# Patient Record
Sex: Female | Born: 1960 | Race: Black or African American | Hispanic: No | Smoking: Never smoker
Health system: Southern US, Community
[De-identification: ages and names within clinical notes are randomized; demographics above are authoritative.]

## PROBLEM LIST (undated history)

## (undated) DIAGNOSIS — I10 Essential (primary) hypertension: Secondary | ICD-10-CM

## (undated) DIAGNOSIS — M7138 Other bursal cyst, other site: Secondary | ICD-10-CM

## (undated) HISTORY — DX: Essential (primary) hypertension: I10

## (undated) HISTORY — DX: Other bursal cyst, other site: M71.38

---

## 2012-05-20 DIAGNOSIS — M1712 Unilateral primary osteoarthritis, left knee: Secondary | ICD-10-CM | POA: Insufficient documentation

## 2021-04-04 ENCOUNTER — Telehealth: Payer: BC Managed Care – PPO | Admitting: Nurse Practitioner

## 2021-04-04 DIAGNOSIS — L309 Dermatitis, unspecified: Secondary | ICD-10-CM | POA: Diagnosis not present

## 2021-04-04 DIAGNOSIS — L089 Local infection of the skin and subcutaneous tissue, unspecified: Secondary | ICD-10-CM

## 2021-04-04 MED ORDER — CEPHALEXIN 500 MG PO CAPS: 500.0000 mg | ORAL_CAPSULE | Freq: Two times a day (BID) | ORAL | 0 refills | Status: AC

## 2021-04-04 MED ORDER — TRIAMCINOLONE ACETONIDE 0.1 % EX CREA: 1.0000 "application " | TOPICAL_CREAM | Freq: Two times a day (BID) | CUTANEOUS | 0 refills | Status: AC

## 2021-04-04 NOTE — Progress Notes (Signed)
Virtual Visit Consent   [facility], you are scheduled for a virtual visit with a [facility] provider today.     Just as with appointments in the office, your consent must be obtained to participate.  Your consent will be active for this visit and any virtual visit you may have with one of our providers in the next 365 days.     If you have a MyChart account, a copy of this consent can be sent to you electronically.  All virtual visits are billed to your insurance company just like a traditional visit in the office.    As this is a virtual visit, video technology does not allow for your provider to perform a traditional examination.  This may limit your provider's ability to fully assess your condition.  If your provider identifies any concerns that need to be evaluated in person or the need to arrange testing (such as labs, EKG, etc.), we will make arrangements to do so.     Although advances in technology are sophisticated, we cannot ensure that it will always work on either your end or our end.  If the connection with a video visit is poor, the visit may have to be switched to a telephone visit.  With either a video or telephone visit, we are not always able to ensure that we have a secure connection.     I need to obtain your verbal consent now.   Are you willing to proceed with your visit today?    Whitney Roman has provided verbal consent on 04/04/2021 for a virtual visit (video or telephone).   Whitney Schneiders, FNP   Date: 04/04/2021 8:15 AM   Virtual Visit via Video Note   I, Whitney Roman, connected with  Whitney Roman  ([id number], 09-13-1960) on 04/04/21 at  8:15 AM EST by a video-enabled telemedicine application and verified that I am speaking with the correct person using two identifiers.  Location: Patient: Virtual Visit Location Patient: Home Provider: Virtual Visit Location Provider: Home Office   I discussed the limitations of evaluation and management by telemedicine and the  availability of in person appointments. The patient expressed understanding and agreed to proceed.    History of Present Illness: Whitney Roman is a 61 y.o. who identifies as a female who was assigned female at birth, and is being seen today for a recurrent itchy ear on the right. Three weeks ago this happened and she noted small bumps that appeared like a bite and she had puss draining from it at that time.  That resolved by placing neosporin to the area.   The ear does not hurt but it is warm to touch. Denies any drainage right now.   She denies any new soaps or lotions. Notes that she has sensitive skin.  Denies any new medications or supplements.    Problems: There are no problems to display for this patient.   No Known Allergies   Current Outpatient Medications  Medication Instructions   LISINOPRIL PO No dose, route, or frequency recorded.   Multiple Vitamin (MULTIVITAMIN ADULT PO) 1 tablet, Oral, Daily     Observations/Objective: Patient is well-developed, well-nourished in no acute distress.  Resting comfortably at home.  Head is normocephalic, atraumatic.  No labored breathing.  Speech is clear and coherent with logical content.  Patient is alert and oriented at baseline.    Assessment and Plan: 1. Dermatitis  - triamcinolone cream (KENALOG) 0.1 %; Apply 1 application topically 2 (two) times  daily for 10 days.  Dispense: 30 g; Refill: 0 Do not use for longer that 2 weeks as discussed  Apply to ear only   2. Skin infection  - cephALEXin (KEFLEX) 500 MG capsule; Take 1 capsule (500 mg total) by mouth 2 (two) times daily for 10 days.  Dispense: 20 capsule; Refill: 0    Wash with warm water and mild soap as discussed   Follow Up Instructions: I discussed the assessment and treatment plan with the patient. The patient was provided an opportunity to ask questions and all were answered. The patient agreed with the plan and demonstrated an understanding of the instructions.   A copy of instructions were sent to the patient via MyChart unless otherwise noted below.    The patient was advised to call back or seek an in-person evaluation if the symptoms worsen or if the condition fails to improve as anticipated.  Time:  I spent 10 minutes with the patient via telehealth technology discussing the above problems/concerns.    Whitney Schneiders, FNP

## 2021-05-27 ENCOUNTER — Telehealth: Payer: BC Managed Care – PPO | Admitting: Family Medicine

## 2021-05-27 DIAGNOSIS — L309 Dermatitis, unspecified: Secondary | ICD-10-CM | POA: Diagnosis not present

## 2021-05-27 DIAGNOSIS — H6011 Cellulitis of right external ear: Secondary | ICD-10-CM

## 2021-05-27 MED ORDER — CEPHALEXIN 500 MG PO CAPS: 500.0000 mg | ORAL_CAPSULE | Freq: Three times a day (TID) | ORAL | 0 refills | Status: AC

## 2021-05-27 NOTE — Progress Notes (Signed)
?Virtual Visit Consent  ? ?[facility], you are scheduled for a virtual visit with a [facility] provider today.   ?  ?Just as with appointments in the office, your consent must be obtained to participate.  Your consent will be active for this visit and any virtual visit you may have with one of our providers in the next 365 days.   ?  ?If you have a MyChart account, a copy of this consent can be sent to you electronically.  All virtual visits are billed to your insurance company just like a traditional visit in the office.   ? ?As this is a virtual visit, video technology does not allow for your provider to perform a traditional examination.  This may limit your provider's ability to fully assess your condition.  If your provider identifies any concerns that need to be evaluated in person or the need to arrange testing (such as labs, EKG, etc.), we will make arrangements to do so.   ?  ?Although advances in technology are sophisticated, we cannot ensure that it will always work on either your end or our end.  If the connection with a video visit is poor, the visit may have to be switched to a telephone visit.  With either a video or telephone visit, we are not always able to ensure that we have a secure connection.    ? ?I need to obtain your verbal consent now.   Are you willing to proceed with your visit today?  ?  ?Whitney Roman has provided verbal consent on 05/27/2021 for a virtual visit (video or telephone). ?  ?Whitney Curio, FNP  ? ?Date: 05/27/2021 11:38 AM ? ? ?Virtual Visit via Video Note  ? ?[name], connected with  Whitney Roman  ([id number], 04-Dec-1960) on 05/27/21 at 11:15 AM EDT by a video-enabled telemedicine application and verified that I am speaking with the correct person using two identifiers. ? ?Location: ?Patient: Virtual Visit Location Patient: Mobile ?Provider: Virtual Visit Location Provider: Home Office ?  ?I discussed the limitations of evaluation and management by telemedicine and the  availability of in person appointments. The patient expressed understanding and agreed to proceed.   ? ?History of Present Illness: ?Whitney Roman is a 61 y.o. who identifies as a female who was assigned female at birth, and is being seen today for ear pain. ? ?HPI: Otalgia  ?There is pain in the right ear. This is a recurrent problem. The current episode started yesterday. The problem occurs every few minutes. The problem has been gradually worsening. There has been no fever. Pertinent negatives include no rhinorrhea.   ?Problems: There are no problems to display for this patient. ?  ?Allergies: No Known Allergies ?Medications:  ?Current Outpatient Medications:  ?  cephALEXin (KEFLEX) 500 MG capsule, Take 1 capsule (500 mg total) by mouth 3 (three) times daily for 7 days., Disp: 21 capsule, Rfl: 0 ?  LISINOPRIL PO, , Disp: , Rfl:  ?  Multiple Vitamin (MULTIVITAMIN ADULT PO), Take 1 tablet by mouth daily., Disp: , Rfl:  ? ?Observations/Objective: ?Patient is well-developed, well-nourished in no acute distress.  ?Resting comfortably.  ?Head is normocephalic, atraumatic.  ?No labored breathing.  ?Speech is clear and coherent with logical content.  ?Patient is alert and oriented at baseline.  ? ? ?Assessment and Plan: ?1. Dermatitis of external ear ?- cephALEXin (KEFLEX) 500 MG capsule; Take 1 capsule (500 mg total) by mouth 3 (three) times daily for 7 days.  Dispense:  21 capsule; Refill: 0 ? ?2. Cellulitis of right external ear ?- cephALEXin (KEFLEX) 500 MG capsule; Take 1 capsule (500 mg total) by mouth 3 (three) times daily for 7 days.  Dispense: 21 capsule; Refill: 0 ? ?Dermatitis with secondary cellulitis rt external ear. Keflex prescribed. Continue triamcinolone cream. Warm compresses. Keep external ear clean and dry. Follow up at urgent care if symptoms worsen.  ? ?Follow Up Instructions: ?I discussed the assessment and treatment plan with the patient. The patient was provided an opportunity to ask questions and  all were answered. The patient agreed with the plan and demonstrated an understanding of the instructions.  A copy of instructions were sent to the patient via MyChart unless otherwise noted below.  ? ? ? ?The patient was advised to call back or seek an in-person evaluation if the symptoms worsen or if the condition fails to improve as anticipated. ? ?Time:  ?I spent 10 minutes with the patient via telehealth technology discussing the above problems/concerns.   ? ?Whitney Curio, FNP ? ?

## 2021-05-27 NOTE — Patient Instructions (Signed)
?Whitney Roman, thank you for joining Georgana Curio, FNP for today's virtual visit.  While this provider is not your primary care provider (PCP), if your PCP is located in our provider database this encounter information will be shared with them immediately following your visit. ? ?Consent: ?(Patient) Bryttani Blew provided verbal consent for this virtual visit at the beginning of the encounter. ? ?Current Medications: ? ?Current Outpatient Medications:  ?  cephALEXin (KEFLEX) 500 MG capsule, Take 1 capsule (500 mg total) by mouth 3 (three) times daily for 7 days., Disp: 21 capsule, Rfl: 0 ?  LISINOPRIL PO, , Disp: , Rfl:  ?  Multiple Vitamin (MULTIVITAMIN ADULT PO), Take 1 tablet by mouth daily., Disp: , Rfl:   ? ?Medications ordered in this encounter:  ?Meds ordered this encounter  ?Medications  ? cephALEXin (KEFLEX) 500 MG capsule  ?  Sig: Take 1 capsule (500 mg total) by mouth 3 (three) times daily for 7 days.  ?  Dispense:  21 capsule  ?  Refill:  0  ?  Order Specific Question:   Supervising Provider  ?  Answer:   Eber Hong [[id number]]  ?  ? ?*If you need refills on other medications prior to your next appointment, please contact your pharmacy* ? ?Follow-Up: ?Call back or seek an in-person evaluation if the symptoms worsen or if the condition fails to improve as anticipated. ? ?Other Instructions ?Atopic Dermatitis ?Atopic dermatitis is a skin disorder that causes inflammation of the skin. It is marked by a red rash and itchy, dry, scaly skin. It is the most common type of eczema. Eczema is a group of skin conditions that cause the skin to become rough and swollen. This condition is generally worse during the cooler winter months and often improves during the warm summer months. ?Atopic dermatitis usually starts showing signs in infancy and can last through adulthood. This condition cannot be passed from one person to another (is not contagious). Atopic dermatitis may not always be present, but when it is, it is  called a flare-up. ?What are the causes? ?The exact cause of this condition is not known. Flare-ups may be triggered by: ?Coming in contact with something that you are sensitive or allergic to (allergen). ?Stress. ?Certain foods. ?Extremely hot or cold weather. ?Harsh chemicals and soaps. ?Dry air. ?Chlorine. ?What increases the risk? ?This condition is more likely to develop in people who have a personal or family history of: ?Eczema. ?Allergies. ?Asthma. ?Hay fever. ?What are the signs or symptoms? ?Symptoms of this condition include: ?Dry, scaly skin. ?Red, itchy rash. ?Itchiness, which can be severe. This may occur before the skin rash. This can make sleeping difficult. ?Skin thickening and cracking that can occur over time. ?How is this diagnosed? ?This condition is diagnosed based on: ?Your symptoms. ?Your medical history. ?A physical exam. ?How is this treated? ?There is no cure for this condition, but symptoms can usually be controlled. Treatment focuses on: ?Controlling the itchiness and scratching. You may be given medicines, such as antihistamines or steroid creams. ?Limiting exposure to allergens. ?Recognizing situations that cause stress and developing a plan to manage stress. ?If your atopic dermatitis does not get better with medicines, or if it is all over your body (widespread), a treatment using a specific type of light (phototherapy) may be used. ?Follow these instructions at home: ?Skin care ? ?Keep your skin well moisturized. Doing this seals in moisture and helps to prevent dryness. ?Use unscented lotions that have petroleum in them. ?Avoid  lotions that contain alcohol or water. They can dry the skin. ?Keep baths or showers short (less than 5 minutes) in warm water. Do not use hot water. ?Use mild, unscented cleansers for bathing. Avoid soap and bubble bath. ?Apply a moisturizer to your skin right after a bath or shower. ?Do not apply anything to your skin without checking with your health care  provider. ?General instructions ?Take or apply over-the-counter and prescription medicines only as told by your health care provider. ?Dress in clothes made of cotton or cotton blends. Dress lightly because heat increases itchiness. ?When washing your clothes, rinse your clothes twice so all of the soap is removed. ?Avoid any triggers that can cause a flare-up. ?Keep your fingernails cut short. ?Avoid scratching. Scratching makes the rash and itchiness worse. A break in the skin from scratching could result in a skin infection (impetigo). ?Do not be around people who have cold sores or fever blisters. If you get the infection, it may cause your atopic dermatitis to worsen. ?Keep all follow-up visits. This is important. ?Contact a health care provider if: ?Your itchiness interferes with sleep. ?Your rash gets worse or is not better within one week of starting treatment. ?You have a fever. ?You have a rash flare-up after having contact with someone who has cold sores or fever blisters. ?Get help right away if: ?You develop pus or soft yellow scabs in the rash area. ?Summary ?Atopic dermatitis causes a red rash and itchy, dry, scaly skin. ?Treatment focuses on controlling the itchiness and scratching, limiting exposure to things that you are sensitive or allergic to (allergens), recognizing situations that cause stress, and developing a plan to manage stress. ?Keep your skin well moisturized. ?Keep baths or showers shorter than 5 minutes and use warm water. Do not use hot water. ?This information is not intended to replace advice given to you by your health care provider. Make sure you discuss any questions you have with your health care provider. ?Document Revised: 12/08/2019 Document Reviewed: 12/08/2019 ?Elsevier Patient Education ? 2022 Elsevier [employer] ? ? ? ?If you have been instructed to have an in-person evaluation today at a local Urgent Care facility, please use the link below. It will take you to a list of all  of our available [facility], including address, phone number and hours of operation. Please do not delay care.  ?[facility] ? ?If you or a family member do not have a primary care provider, use the link below to schedule a visit and establish care. When you choose a [facility] primary care physician or advanced practice provider, you gain a long-term partner in health. ?Find a Primary Care Provider ? ?Learn more about [facility]'s in-office and virtual care options: ? - Get Care Now ? ?

## 2021-06-03 ENCOUNTER — Ambulatory Visit (INDEPENDENT_AMBULATORY_CARE_PROVIDER_SITE_OTHER): Payer: BC Managed Care – PPO | Admitting: Nurse Practitioner

## 2021-06-03 ENCOUNTER — Other Ambulatory Visit: Payer: Self-pay

## 2021-06-03 ENCOUNTER — Encounter: Payer: Self-pay | Admitting: Nurse Practitioner

## 2021-06-03 VITALS — BP 136/75 | HR 87 | Temp 98.1°F | Ht 62.0 in | Wt 195.4 lb

## 2021-06-03 DIAGNOSIS — Z7689 Persons encountering health services in other specified circumstances: Secondary | ICD-10-CM

## 2021-06-03 DIAGNOSIS — I10 Essential (primary) hypertension: Secondary | ICD-10-CM

## 2021-06-03 DIAGNOSIS — R635 Abnormal weight gain: Secondary | ICD-10-CM

## 2021-06-03 DIAGNOSIS — R5383 Other fatigue: Secondary | ICD-10-CM | POA: Diagnosis not present

## 2021-06-03 DIAGNOSIS — Z6835 Body mass index (BMI) 35.0-35.9, adult: Secondary | ICD-10-CM | POA: Diagnosis not present

## 2021-06-03 DIAGNOSIS — Z1231 Encounter for screening mammogram for malignant neoplasm of breast: Secondary | ICD-10-CM

## 2021-06-03 NOTE — Progress Notes (Signed)
? ?New Patient Office Visit ? ?Subjective:  ?Patient ID: Whitney Roman, female    DOB: [birth year]  Age: 61 y.o. MRN: [id number] ? ?CC:  ?Chief Complaint  ?Patient presents with  ? New Patient (Initial Visit)  ? ? ?HPI ?Whitney Roman presents to establish new primary care care provider. She moved to the area from [place], [place].  ?-history of hypertension.  ?-concern about weight. States that she has always been concerned about weight, but now, wants to get a healthier version of herself.  ?-currently o antibiotics due to infection of right ear . ?-due to have routine, fasting labs and well woman exam.  ?-due to have screening mammogram  ? ?History reviewed. No pertinent past medical history. ? ?History reviewed. No pertinent surgical history. ? ?Family History  ?Problem Relation Age of Onset  ? High blood pressure Father   ? ? ?Social History  ? ?Socioeconomic History  ? Marital status: Unknown  ?  Spouse name: Not on file  ? Number of children: Not on file  ? Years of education: Not on file  ? Highest education level: Not on file  ?Occupational History  ? Not on file  ?Tobacco Use  ? Smoking status: Never  ? Smokeless tobacco: Never  ?Substance and Sexual Activity  ? Alcohol use: Yes  ?  Comment: 1  ? Drug use: Never  ? Sexual activity: Not Currently  ?Other Topics Concern  ? Not on file  ?Social History Narrative  ? Not on file  ? ?Social Determinants of Health  ? ?Financial Resource Strain: Not on file  ?Food Insecurity: Not on file  ?Transportation Needs: Not on file  ?Physical Activity: Not on file  ?Stress: Not on file  ?Social Connections: Not on file  ?Intimate Partner Violence: Not on file  ? ? ?ROS ?Review of Systems  ?Constitutional:  Positive for fatigue. Negative for activity change, appetite change, chills and fever.  ?HENT:  Positive for congestion and ear pain. Negative for postnasal drip, rhinorrhea, sinus pressure, sinus pain, sneezing and sore throat.   ?Eyes: Negative.   ?Respiratory:  Negative for  cough, chest tightness, shortness of breath and wheezing.   ?Cardiovascular:  Negative for chest pain and palpitations.  ?Gastrointestinal:  Negative for abdominal pain, constipation, diarrhea, nausea and vomiting.  ?Endocrine: Negative for cold intolerance, heat intolerance, polydipsia and polyuria.  ?Genitourinary:  Negative for dyspareunia, dysuria, flank pain, frequency and urgency.  ?Musculoskeletal:  Positive for arthralgias, gait problem and myalgias. Negative for back pain.  ?     Joint pain in multiple areas.   ?Skin:  Negative for rash.  ?Allergic/Immunologic: Negative for environmental allergies.  ?Neurological:  Negative for dizziness, weakness and headaches.  ?Hematological:  Negative for adenopathy.  ?Psychiatric/Behavioral:  The patient is not nervous/anxious.   ? ?Objective:  ? ?Today's Vitals  ? 06/03/21 1046  ?BP: 136/75  ?Pulse: 87  ?Temp: 98.1 ?F (36.7 ?C)  ?SpO2: 97%  ?Weight: 195 lb 6.4 oz (88.6 kg)  ?Height: 5\' 2"  (1.575 m)  ? ?Body mass index is 35.74 kg/m?.  ? ?Physical Exam ?Vitals and nursing note reviewed.  ?Constitutional:   ?   Appearance: Normal appearance. She is well-developed.  ?HENT:  ?   Head: Normocephalic and atraumatic.  ?   Nose: Nose normal.  ?   Mouth/Throat:  ?   Mouth: Mucous membranes are moist.  ?   Pharynx: Oropharynx is clear.  ?Eyes:  ?   Extraocular Movements: Extraocular movements intact.  ?  Conjunctiva/sclera: Conjunctivae normal.  ?   Pupils: Pupils are equal, round, and reactive to light.  ?Cardiovascular:  ?   Rate and Rhythm: Normal rate and regular rhythm.  ?   Pulses: Normal pulses.  ?   Heart sounds: Normal heart sounds.  ?Pulmonary:  ?   Effort: Pulmonary effort is normal.  ?   Breath sounds: Normal breath sounds.  ?Abdominal:  ?   Palpations: Abdomen is soft.  ?Musculoskeletal:     ?   General: Normal range of motion.  ?   Cervical back: Normal range of motion and neck supple.  ?Lymphadenopathy:  ?   Cervical: No cervical adenopathy.  ?Skin: ?   General:  Skin is warm and dry.  ?   Capillary Refill: Capillary refill takes less than 2 seconds.  ?Neurological:  ?   General: No focal deficit present.  ?   Mental Status: She is alert and oriented to person, place, and time.  ?Psychiatric:     ?   Mood and Affect: Mood normal.     ?   Behavior: Behavior normal.     ?   Thought Content: Thought content normal.     ?   Judgment: Judgment normal.  ? ? ?Assessment & Plan:  ?1. Essential hypertension ?Blood pressure currently well managed.  Continue lisinopril as prescribed. ? ?2. Other fatigue ?We will check routine, fasting labs, including full thyroid panel and anemia panel at next visit. ? ?3. Abnormal weight gain ?Check thyroid panel at next visit. ? ?4. Body mass index (BMI) of 35.0-35.9 in adult ?Discussed lowering calorie intake to 1500 calories per day and incorporating exercise into daily routine to help lose weight.  ? ?5. Encounter for screening mammogram for malignant neoplasm of breast ?Screening mammogram ordered today. ?- MM DIGITAL SCREENING BILATERAL; Future ? ?6. Encounter to establish care ?Appointment today to establish new primary care provider   ? ?  ?Problem List Items Addressed This Visit   ? ?  ? Cardiovascular and Mediastinum  ? Essential hypertension - Primary  ?  ? Other  ? Other fatigue  ? Abnormal weight gain  ? Body mass index (BMI) of 35.0-35.9 in adult  ? ?Other Visit Diagnoses   ? ? Encounter for screening mammogram for malignant neoplasm of breast      ? Relevant Orders  ? MM DIGITAL SCREENING BILATERAL  ? Encounter to establish care      ? ?  ? ? ?Outpatient Encounter Medications as of 06/03/2021  ?Medication Sig  ? [EXPIRED] cephALEXin (KEFLEX) 500 MG capsule Take 1 capsule (500 mg total) by mouth 3 (three) times daily for 7 days.  ? LISINOPRIL PO   ? Multiple Vitamin (MULTIVITAMIN ADULT PO) Take 1 tablet by mouth daily.  ? ?No facility-administered encounter medications on file as of 06/03/2021.  ? ? ?Follow-up: Return in about 3 weeks  (around 06/24/2021) for health maintenance exam, with pap, FBW at time of visit - AM appointment please - .  ? ?Carlean Jews, NP ? ?

## 2021-06-12 DIAGNOSIS — Z6835 Body mass index (BMI) 35.0-35.9, adult: Secondary | ICD-10-CM

## 2021-06-12 DIAGNOSIS — R635 Abnormal weight gain: Secondary | ICD-10-CM | POA: Insufficient documentation

## 2021-06-12 DIAGNOSIS — R5383 Other fatigue: Secondary | ICD-10-CM | POA: Insufficient documentation

## 2021-06-12 DIAGNOSIS — I10 Essential (primary) hypertension: Secondary | ICD-10-CM | POA: Insufficient documentation

## 2021-06-12 HISTORY — DX: Body mass index (BMI) 35.0-35.9, adult: Z68.35

## 2021-06-12 HISTORY — DX: Abnormal weight gain: R63.5

## 2021-06-12 HISTORY — DX: Other fatigue: R53.83

## 2021-06-24 ENCOUNTER — Encounter: Payer: BC Managed Care – PPO | Admitting: Nurse Practitioner

## 2021-06-30 ENCOUNTER — Ambulatory Visit: Payer: BC Managed Care – PPO | Admitting: Nurse Practitioner

## 2021-07-11 ENCOUNTER — Ambulatory Visit: Payer: BC Managed Care – PPO | Admitting: Nurse Practitioner

## 2021-07-11 ENCOUNTER — Ambulatory Visit: Payer: BC Managed Care – PPO

## 2021-07-11 ENCOUNTER — Encounter: Payer: Self-pay | Admitting: Nurse Practitioner

## 2021-07-11 VITALS — BP 135/76 | HR 82 | Temp 98.2°F | Ht 61.81 in | Wt 194.7 lb

## 2021-07-11 DIAGNOSIS — I10 Essential (primary) hypertension: Secondary | ICD-10-CM

## 2021-07-11 DIAGNOSIS — M170 Bilateral primary osteoarthritis of knee: Secondary | ICD-10-CM

## 2021-07-11 DIAGNOSIS — Z13228 Encounter for screening for other metabolic disorders: Secondary | ICD-10-CM

## 2021-07-11 DIAGNOSIS — Z1329 Encounter for screening for other suspected endocrine disorder: Secondary | ICD-10-CM

## 2021-07-11 DIAGNOSIS — Z130 Encounter for screening for diseases of the blood and blood-forming organs and certain disorders involving the immune mechanism: Secondary | ICD-10-CM

## 2021-07-11 DIAGNOSIS — Z0001 Encounter for general adult medical examination with abnormal findings: Secondary | ICD-10-CM | POA: Diagnosis not present

## 2021-07-11 DIAGNOSIS — Z6835 Body mass index (BMI) 35.0-35.9, adult: Secondary | ICD-10-CM

## 2021-07-11 DIAGNOSIS — Z0000 Encounter for general adult medical examination without abnormal findings: Secondary | ICD-10-CM

## 2021-07-11 DIAGNOSIS — Z1321 Encounter for screening for nutritional disorder: Secondary | ICD-10-CM

## 2021-07-11 HISTORY — DX: Bilateral primary osteoarthritis of knee: M17.0

## 2021-07-11 MED ORDER — LISINOPRIL 5 MG PO TABS: 5.0000 mg | ORAL_TABLET | Freq: Every day | ORAL | 1 refills | Status: DC

## 2021-07-11 NOTE — Progress Notes (Signed)
Established patient visit ? ? ?Patient: Whitney Roman   DOB: Feb 08, 1961   61 y.o. Female  MRN: [id number] ?Visit Date: 07/11/2021 ? ? ?Chief Complaint  ?Patient presents with  ? Annual Exam  ? ?Subjective  ?  ?HPI  ?Annual physical she declines pap smear today.  ?-needs to have fasting blood work done. Will draw this while she is here today  ?-scheduled to have screening mammogram later today.  ?-blood pressure typically well controlled.  ?-bilateral knee pain, worse on the left than the right. While living in [place], she did have orthopedic provider who did give her injections into the knee which helped. She would like to be referred to Dr. Ennis Forts, in [place] for further evaluation and treatment . ?-she is concerned about weight. Would like to get it down. Feels like getting her weight down, will help improve her knee pain at least some.  ?-patient states that she would like to be referred to GI for screening colonoscopy. She will call or message the office with provider she would like to see. Will make referral.  ? ?Medications: ?Outpatient Medications Prior to Visit  ?Medication Sig  ? Multiple Vitamin (MULTIVITAMIN ADULT PO) Take 1 tablet by mouth daily.  ? [DISCONTINUED] LISINOPRIL PO   ? ?No facility-administered medications prior to visit.  ? ? ?Review of Systems  ?Constitutional:  Negative for activity change, appetite change, chills, fatigue and fever.  ?HENT:  Negative for congestion, postnasal drip, rhinorrhea, sinus pressure, sinus pain, sneezing and sore throat.   ?Eyes: Negative.   ?Respiratory:  Negative for cough, chest tightness, shortness of breath and wheezing.   ?Cardiovascular:  Negative for chest pain and palpitations.  ?Gastrointestinal:  Negative for abdominal pain, constipation, diarrhea, nausea and vomiting.  ?Endocrine: Negative for cold intolerance, heat intolerance, polydipsia and polyuria.  ?Genitourinary:  Negative for dyspareunia, dysuria, flank pain, frequency and urgency.   ?Musculoskeletal:  Positive for arthralgias and joint swelling. Negative for back pain and myalgias.  ?     Pain in both knees, but primarily the left side.   ?Skin:  Negative for rash.  ?Allergic/Immunologic: Negative for environmental allergies.  ?Neurological:  Negative for dizziness, weakness and headaches.  ?Hematological:  Negative for adenopathy.  ?Psychiatric/Behavioral:  The patient is not nervous/anxious.   ? ? ? ? Objective  ?  ? ?Today's Vitals  ? 07/11/21 0816  ?BP: 135/76  ?Pulse: 82  ?Temp: 98.2 ?F (36.8 ?C)  ?SpO2: 100%  ?Weight: 194 lb 11.2 oz (88.3 kg)  ?Height: 5' 1.81" (1.57 m)  ? ?Body mass index is 35.83 kg/m?.  ? ?BP Readings from Last 3 Encounters:  ?07/11/21 135/76  ?06/03/21 136/75  ?  ?Wt Readings from Last 3 Encounters:  ?07/11/21 194 lb 11.2 oz (88.3 kg)  ?06/03/21 195 lb 6.4 oz (88.6 kg)  ?  ?Physical Exam ?Vitals and nursing note reviewed.  ?Constitutional:   ?   Appearance: Normal appearance. She is well-developed.  ?HENT:  ?   Head: Normocephalic and atraumatic.  ?   Right Ear: Tympanic membrane, ear canal and external ear normal.  ?   Left Ear: Tympanic membrane, ear canal and external ear normal.  ?   Nose: Nose normal.  ?   Mouth/Throat:  ?   Mouth: Mucous membranes are moist.  ?   Pharynx: Oropharynx is clear.  ?Eyes:  ?   Conjunctiva/sclera: Conjunctivae normal.  ?   Pupils: Pupils are equal, round, and reactive to light.  ?Neck:  ?  Vascular: No carotid bruit.  ?Cardiovascular:  ?   Rate and Rhythm: Normal rate and regular rhythm.  ?   Pulses: Normal pulses.  ?   Heart sounds: Normal heart sounds.  ?Pulmonary:  ?   Effort: Pulmonary effort is normal.  ?   Breath sounds: Normal breath sounds.  ?Abdominal:  ?   General: Bowel sounds are normal. There is no distension.  ?   Palpations: Abdomen is soft. There is no mass.  ?   Tenderness: There is no abdominal tenderness. There is no right CVA tenderness, left CVA tenderness, guarding or rebound.  ?   Hernia: No hernia is present.   ?Musculoskeletal:  ?   Cervical back: Normal range of motion and neck supple.  ?   Left knee: Swelling, bony tenderness and crepitus present. Decreased range of motion. Tenderness present.  ?Lymphadenopathy:  ?   Cervical: No cervical adenopathy.  ?Skin: ?   General: Skin is warm and dry.  ?   Capillary Refill: Capillary refill takes less than 2 seconds.  ?Neurological:  ?   General: No focal deficit present.  ?   Mental Status: She is alert and oriented to person, place, and time.  ?Psychiatric:     ?   Mood and Affect: Mood normal.     ?   Behavior: Behavior normal.     ?   Thought Content: Thought content normal.     ?   Judgment: Judgment normal.  ?  ? ? Assessment & Plan  ?  ?1. Encounter for general adult medical examination with abnormal findings ?Annual physical today. Routine, fasting labs drawn during today's visit.  ? ?2. Essential hypertension ?Blood pressure stable. Continue lisinopril 5mg  daily. Refills provided today  ?- lisinopril (ZESTRIL) 5 MG tablet; Take 1 tablet (5 mg total) by mouth daily.  Dispense: 90 tablet; Refill: 1 ? ?3. Primary osteoarthritis of knees, bilateral ?Referral placed to Dr. Tacey Ruiz in [place] per patient request.  ?- Ambulatory referral to Orthopedic Surgery ? ?4. Body mass index (BMI) of 35.0-35.9 in adult ?Discussed lowering calorie intake to 1500 calories per day and incorporating exercise into daily routine to help lose weight. Will discuss medically managed weight loss options after lab results are back.  ? ?5. Screening for endocrine, nutritional, metabolic and immunity disorder ?Routine, fasting labs drawn during today's visit.  ?- Comp Met (CMET); Future ?- TSH; Future ?- CBC with Differential/Platelet; Future ?- Hemoglobin A1c; Future ?- Lipid panel; Future ?- Lipid panel ?- Hemoglobin A1c ?- CBC with Differential/Platelet ?- TSH ?- Comp Met (CMET) ? ?6. Healthcare maintenance ?Routine, fasting labs drawn during today's visit.  ?- Comp Met (CMET); Future ?-  TSH; Future ?- CBC with Differential/Platelet; Future ?- Hemoglobin A1c; Future ?- Lipid panel; Future ?- Lipid panel ?- Hemoglobin A1c ?- CBC with Differential/Platelet ?- TSH ?- Comp Met (CMET)  ? ? ?Problem List Items Addressed This Visit   ? ?  ? Cardiovascular and Mediastinum  ? Essential hypertension  ? Relevant Medications  ? lisinopril (ZESTRIL) 5 MG tablet  ?  ? Musculoskeletal and Integument  ? Primary osteoarthritis of knees, bilateral  ? Relevant Orders  ? Ambulatory referral to Orthopedic Surgery  ?  ? Other  ? Body mass index (BMI) of 35.0-35.9 in adult  ? ?Other Visit Diagnoses   ? ? Encounter for general adult medical examination with abnormal findings    -  Primary  ? Screening for endocrine, nutritional, metabolic and immunity disorder      ?  Relevant Orders  ? Comp Met (CMET)  ? TSH  ? CBC with Differential/Platelet  ? Hemoglobin A1c  ? Lipid panel  ? Healthcare maintenance      ? Relevant Orders  ? Comp Met (CMET)  ? TSH  ? CBC with Differential/Platelet  ? Hemoglobin A1c  ? Lipid panel  ? ?  ?  ? ?Return in about 6 months (around 01/11/2022) for blood pressure.  ?   ? ? ? ? ?Ronnell Freshwater, NP  ? [facility] ?[phone] (phone) ?[phone] (fax) ? ? [facility]  ?

## 2021-07-12 LAB — LIPID PANEL
Chol/HDL Ratio: 3 ratio (ref 0.0–4.4)
Cholesterol, Total: 220 mg/dL — ABNORMAL HIGH (ref 100–199)
HDL: 74 mg/dL (ref >39)
LDL Chol Calc (NIH): 136 mg/dL — ABNORMAL HIGH (ref 0–99)
Triglycerides: 58 mg/dL (ref 0–149)
VLDL Cholesterol Cal: 10 mg/dL (ref 5–40)

## 2021-07-12 LAB — COMPREHENSIVE METABOLIC PANEL
ALT: 22 IU/L (ref 0–32)
AST: 18 IU/L (ref 0–40)
Albumin/Globulin Ratio: 1.7 (ref 1.2–2.2)
Albumin: 4.3 g/dL (ref 3.8–4.9)
Alkaline Phosphatase: 78 IU/L (ref 44–121)
BUN/Creatinine Ratio: 15 (ref 9–23)
BUN: 13 mg/dL (ref 6–24)
Bilirubin Total: 0.5 mg/dL (ref 0.0–1.2)
CO2: 24 mmol/L (ref 20–29)
Calcium: 9.7 mg/dL (ref 8.7–10.2)
Chloride: 103 mmol/L (ref 96–106)
Creatinine, Ser: 0.88 mg/dL (ref 0.57–1.00)
Globulin, Total: 2.5 g/dL (ref 1.5–4.5)
Glucose: 93 mg/dL (ref 70–99)
Potassium: 4.2 mmol/L (ref 3.5–5.2)
Sodium: 142 mmol/L (ref 134–144)
Total Protein: 6.8 g/dL (ref 6.0–8.5)
eGFR: 76 mL/min/{1.73_m2} (ref >59)

## 2021-07-12 LAB — CBC WITH DIFFERENTIAL/PLATELET
Basophils Absolute: 0.1 x10E3/uL (ref 0.0–0.2)
Basos: 1 %
EOS (ABSOLUTE): 0.2 x10E3/uL (ref 0.0–0.4)
Eos: 3 %
Hematocrit: 40.1 % (ref 34.0–46.6)
Hemoglobin: 12.9 g/dL (ref 11.1–15.9)
Immature Grans (Abs): 0 x10E3/uL (ref 0.0–0.1)
Immature Granulocytes: 0 %
Lymphocytes Absolute: 1.7 x10E3/uL (ref 0.7–3.1)
Lymphs: 25 %
MCH: 26.8 pg (ref 26.6–33.0)
MCHC: 32.2 g/dL (ref 31.5–35.7)
MCV: 83 fL (ref 79–97)
Monocytes Absolute: 0.6 x10E3/uL (ref 0.1–0.9)
Monocytes: 8 %
Neutrophils Absolute: 4.4 x10E3/uL (ref 1.4–7.0)
Neutrophils: 63 %
Platelets: 242 x10E3/uL (ref 150–450)
RBC: 4.82 x10E6/uL (ref 3.77–5.28)
RDW: 12.6 % (ref 11.7–15.4)
WBC: 6.9 x10E3/uL (ref 3.4–10.8)

## 2021-07-12 LAB — HEMOGLOBIN A1C
Est. average glucose Bld gHb Est-mCnc: 111 mg/dL
Hgb A1c MFr Bld: 5.5 % (ref 4.8–5.6)

## 2021-07-12 LAB — TSH: TSH: 0.583 u[IU]/mL (ref 0.450–4.500)

## 2021-07-14 ENCOUNTER — Encounter: Payer: Self-pay | Admitting: Nurse Practitioner

## 2021-07-22 ENCOUNTER — Ambulatory Visit
Admission: RE | Admit: 2021-07-22 | Discharge: 2021-07-22 | Disposition: A | Discharge status: Home / Self Care | Payer: BC Managed Care – PPO | Source: Ambulatory Visit | Attending: Nurse Practitioner | Admitting: Nurse Practitioner

## 2021-07-22 DIAGNOSIS — Z1231 Encounter for screening mammogram for malignant neoplasm of breast: Secondary | ICD-10-CM

## 2021-07-27 NOTE — Progress Notes (Signed)
Negative mammogram

## 2021-08-11 HISTORY — PX: TOTAL KNEE ARTHROPLASTY: SHX125

## 2021-10-18 ENCOUNTER — Telehealth: Payer: BC Managed Care – PPO | Admitting: Physician Assistant

## 2021-10-18 ENCOUNTER — Telehealth: Payer: BC Managed Care – PPO

## 2021-10-18 ENCOUNTER — Other Ambulatory Visit: Payer: Self-pay | Admitting: Physician Assistant

## 2021-10-18 DIAGNOSIS — L729 Follicular cyst of the skin and subcutaneous tissue, unspecified: Secondary | ICD-10-CM

## 2021-10-18 DIAGNOSIS — L089 Local infection of the skin and subcutaneous tissue, unspecified: Secondary | ICD-10-CM | POA: Diagnosis not present

## 2021-10-18 MED ORDER — CEPHALEXIN 500 MG PO CAPS: 500.0000 mg | ORAL_CAPSULE | Freq: Two times a day (BID) | ORAL | 0 refills | Status: AC

## 2022-01-26 DIAGNOSIS — M7138 Other bursal cyst, other site: Secondary | ICD-10-CM | POA: Insufficient documentation

## 2022-02-07 HISTORY — PX: HEMILAMINOTOMY LUMBAR SPINE: SUR654

## 2022-03-28 ENCOUNTER — Encounter: Payer: Self-pay | Admitting: Family Medicine

## 2022-03-28 ENCOUNTER — Ambulatory Visit: Payer: BC Managed Care – PPO | Admitting: Family Medicine

## 2022-03-28 VITALS — BP 143/92 | HR 91 | Ht 62.0 in | Wt 194.9 lb

## 2022-03-28 DIAGNOSIS — Z1211 Encounter for screening for malignant neoplasm of colon: Secondary | ICD-10-CM | POA: Diagnosis not present

## 2022-03-28 DIAGNOSIS — I10 Essential (primary) hypertension: Secondary | ICD-10-CM

## 2022-03-28 DIAGNOSIS — M7138 Other bursal cyst, other site: Secondary | ICD-10-CM | POA: Diagnosis not present

## 2022-03-28 DIAGNOSIS — Z7689 Persons encountering health services in other specified circumstances: Secondary | ICD-10-CM

## 2022-03-28 MED ORDER — LISINOPRIL 20 MG PO TABS: 20.0000 mg | ORAL_TABLET | Freq: Every day | ORAL | 0 refills | Status: DC

## 2022-03-28 NOTE — Progress Notes (Signed)
New Patient Office Visit  Subjective    Patient ID: Whitney Roman, female    DOB: 05/06/1960  Age: 62 y.o. MRN: [id number]  CC:  Chief Complaint  Patient presents with   Establish Care   Annual Exam    She is requesting a PAP smear.    HPI Whitney Roman presents to establish care. She use to see PCP in [place].  She lives in [place]. She is taking Lisinopril 5mg  daily for HTN. She's been on this dose for years.  She has hx of back pain and had recent MRI of L spine in October. Has confirmed spinal stenosis and synovial cyst that she is taking Diclofenac BID for this. She has had hemilaminotomy in Nov 2023. She has hx of left knee TKA also in June 2023.  She is up to date with pap smear. Last had in 2019. She has had mammogram.  She would like to be referred for colonoscopy.  Outpatient Encounter Medications as of 03/28/2022  Medication Sig   diclofenac (VOLTAREN) 75 MG EC tablet Take 75 mg by mouth 2 (two) times daily.   Multiple Vitamin (MULTIVITAMIN ADULT PO) Take 1 tablet by mouth daily.   [DISCONTINUED] lisinopril (ZESTRIL) 5 MG tablet Take 1 tablet (5 mg total) by mouth daily.   lisinopril (ZESTRIL) 20 MG tablet Take 1 tablet (20 mg total) by mouth daily.   [DISCONTINUED] gabapentin (NEURONTIN) 300 MG capsule Take by mouth.   [DISCONTINUED] gabapentin (NEURONTIN) 300 MG capsule Take 300 mg by mouth daily. (Patient not taking: Reported on 03/28/2022)   [DISCONTINUED] lisinopril (ZESTRIL) 5 MG tablet Take by mouth. (Patient not taking: Reported on 03/28/2022)   [DISCONTINUED] meloxicam (MOBIC) 7.5 MG tablet Take 7.5 mg by mouth daily. (Patient not taking: Reported on 03/28/2022)   No facility-administered encounter medications on file as of 03/28/2022.    Past Medical History:  Diagnosis Date   HTN (hypertension)    Synovial cyst of lumbar spine     Past Surgical History:  Procedure Laterality Date   HEMILAMINOTOMY LUMBAR SPINE Left 02/07/2022   TOTAL KNEE  ARTHROPLASTY Left 08/2021    Family History  Problem Relation Age of Onset   Dementia Mother    High blood pressure Father    Hearing loss Father    Breast cancer Sister 3    Social History   Socioeconomic History   Marital status: Widowed    Spouse name: Not on file   Number of children: 1   Years of education: Not on file   Highest education level: Not on file  Occupational History   Not on file  Tobacco Use   Smoking status: Never   Smokeless tobacco: Never  Substance and Sexual Activity   Alcohol use: Yes    Comment: 1   Drug use: Never   Sexual activity: Not Currently  Other Topics Concern   Not on file  Social History Narrative   Not on file   Social Determinants of Health   Financial Resource Strain: Not on file  Food Insecurity: Not on file  Transportation Needs: Not on file  Physical Activity: Not on file  Stress: Not on file  Social Connections: Not on file  Intimate Partner Violence: Not on file    Review of Systems  All other systems reviewed and are negative.      Objective    BP (!) 143/92   Pulse 91   Ht 5\' 2"  (1.575 m)   Wt 194 lb  14.4 oz (88.4 kg)   SpO2 99%   BMI 35.65 kg/m   Physical Exam Vitals and nursing note reviewed.  Constitutional:      Appearance: She is obese.  HENT:     Head: Normocephalic and atraumatic.     Right Ear: Tympanic membrane, ear canal and external ear normal.     Left Ear: Tympanic membrane, ear canal and external ear normal.     Nose: Nose normal.     Mouth/Throat:     Mouth: Mucous membranes are moist.     Pharynx: Oropharynx is clear.  Eyes:     Conjunctiva/sclera: Conjunctivae normal.     Pupils: Pupils are equal, round, and reactive to light.  Cardiovascular:     Rate and Rhythm: Normal rate and regular rhythm.     Pulses: Normal pulses.     Heart sounds: Normal heart sounds.  Pulmonary:     Effort: Pulmonary effort is normal.     Breath sounds: Normal breath sounds.  Abdominal:      General: Abdomen is flat.  Skin:    General: Skin is warm.     Capillary Refill: Capillary refill takes less than 2 seconds.  Neurological:     General: No focal deficit present.     Mental Status: She is alert and oriented to person, place, and time. Mental status is at baseline.  Psychiatric:        Mood and Affect: Mood normal.        Behavior: Behavior normal.        Thought Content: Thought content normal.        Judgment: Judgment normal.       Assessment & Plan:   Problem List Items Addressed This Visit       Cardiovascular and Mediastinum   Essential hypertension   Relevant Medications   lisinopril (ZESTRIL) 20 MG tablet     Musculoskeletal and Integument   Synovial cyst of lumbar facet joint   Other Visit Diagnoses     Encounter to establish care with new doctor    -  Primary   Screening for colon cancer       Relevant Orders   Ambulatory referral to Gastroenterology     Blood pressure elevated in office. Will increase Lisinopril from 5 mg to 20mg  daily. To check blood pressure outside the office and return in May for annual and labs Up to date with pap until 02/2023 Refer for colonoscopy to [facility].    Return in about 4 months (around 07/13/2022) for Annual Physical.   Leeanne Rio, MD

## 2022-03-29 DIAGNOSIS — M5416 Radiculopathy, lumbar region: Secondary | ICD-10-CM | POA: Insufficient documentation

## 2022-03-29 DIAGNOSIS — R6889 Other general symptoms and signs: Secondary | ICD-10-CM | POA: Insufficient documentation

## 2022-05-11 ENCOUNTER — Ambulatory Visit (INDEPENDENT_AMBULATORY_CARE_PROVIDER_SITE_OTHER): Payer: BC Managed Care – PPO | Admitting: Family Medicine

## 2022-05-11 VITALS — BP 145/81 | HR 85 | Temp 98.7°F | Resp 20 | Ht 62.0 in | Wt 195.0 lb

## 2022-05-11 DIAGNOSIS — I10 Essential (primary) hypertension: Secondary | ICD-10-CM | POA: Diagnosis not present

## 2022-05-11 NOTE — Progress Notes (Signed)
Pleasant 62 year old female is coming in for a BP check, last BP 03/28/22 and her BP at that time was 143/92 , Patient was prescribed Lisinopril '20mg'$  , patient takes medication QD PO, Patient reports she is taking medication and responding well. Patient reports no reactions, or skipped doses. Patient states that she just took medication 20 minutes prior to appointment

## 2022-06-08 ENCOUNTER — Other Ambulatory Visit: Payer: Self-pay | Admitting: Family Medicine

## 2022-06-08 DIAGNOSIS — I10 Essential (primary) hypertension: Secondary | ICD-10-CM

## 2022-06-29 ENCOUNTER — Telehealth: Payer: Self-pay | Admitting: Family Medicine

## 2022-06-29 NOTE — Telephone Encounter (Signed)
Patient called to state that she is nearly out of her lisinopril (bp medicine) and would like a refill, as her next appointment is 05/16. Please advise. Katha Hamming

## 2022-06-29 NOTE — Telephone Encounter (Signed)
Spoke with patient and advised her of Dr. Lucrezia Europe' note regarding her Lisinopril , Patient gave a verbal understanding and states that she will call her pharmacy because her bottle has 0 refills. Advised patient if there were any concerns after she speaks with pharmacy to give [place] a call back

## 2022-07-27 ENCOUNTER — Encounter: Payer: Self-pay | Admitting: Family Medicine

## 2022-07-27 ENCOUNTER — Ambulatory Visit (INDEPENDENT_AMBULATORY_CARE_PROVIDER_SITE_OTHER): Payer: BC Managed Care – PPO | Admitting: Family Medicine

## 2022-07-27 VITALS — BP 139/85 | HR 87 | Temp 98.2°F | Resp 18 | Ht 62.0 in | Wt 180.8 lb

## 2022-07-27 DIAGNOSIS — Z13220 Encounter for screening for lipoid disorders: Secondary | ICD-10-CM

## 2022-07-27 DIAGNOSIS — R7302 Impaired glucose tolerance (oral): Secondary | ICD-10-CM

## 2022-07-27 DIAGNOSIS — Z0000 Encounter for general adult medical examination without abnormal findings: Secondary | ICD-10-CM

## 2022-07-27 DIAGNOSIS — I10 Essential (primary) hypertension: Secondary | ICD-10-CM | POA: Diagnosis not present

## 2022-07-27 MED ORDER — LISINOPRIL 20 MG PO TABS: 20.0000 mg | ORAL_TABLET | Freq: Every day | ORAL | 3 refills | Status: DC

## 2022-07-27 NOTE — Progress Notes (Signed)
Complete physical exam  Patient: Whitney Roman   DOB: 03-06-1961   62 y.o. Female  MRN: [id number]  Subjective:    Chief Complaint  Patient presents with   Annual Exam    Patient states that she has no concerns today to discuss    Pollyann Cocke is a 62 y.o. female who presents today for a complete physical exam. She reports consuming a  intermittent fasting  diet. Home exercise routine includes walking a mile a day. She generally feels well. She reports sleeping fairly well. She does not have additional problems to discuss today.  UTD with mammogram, pap and colonoscopy. Would like to think about her Shingrix vaccine.  Most recent fall risk assessment:    03/28/2022    8:46 AM  Fall Risk   Falls in the past year? 0  Number falls in past yr: 0  Injury with Fall? 0  Risk for fall due to : No Fall Risks  Follow up Falls evaluation completed     Most recent depression screenings:    03/28/2022    8:46 AM 07/11/2021    8:34 AM  PHQ 2/9 Scores  PHQ - 2 Score 0 0  PHQ- 9 Score 4 1    Vision:Within last year  Patient Active Problem List   Diagnosis Date Noted   Decreased functional activity tolerance 03/29/2022   Lumbar radiculopathy 03/29/2022   Synovial cyst of lumbar facet joint 01/26/2022   Primary osteoarthritis of knees, bilateral 07/11/2021   Essential hypertension 06/12/2021   Other fatigue 06/12/2021   Abnormal weight gain 06/12/2021   Body mass index (BMI) of 35.0-35.9 in adult 06/12/2021   Localized osteoarthritis of left knee 05/20/2012   Past Medical History:  Diagnosis Date   Abnormal weight gain 06/12/2021   Body mass index (BMI) of 35.0-35.9 in adult 06/12/2021   HTN (hypertension)    Other fatigue 06/12/2021   Primary osteoarthritis of knees, bilateral 07/11/2021   Synovial cyst of lumbar spine    Past Surgical History:  Procedure Laterality Date   HEMILAMINOTOMY LUMBAR SPINE Left 02/07/2022   TOTAL KNEE ARTHROPLASTY Left 08/2021   Social History    Socioeconomic History   Marital status: Widowed    Spouse name: Not on file   Number of children: 1   Years of education: Not on file   Highest education level: Not on file  Occupational History   Not on file  Tobacco Use   Smoking status: Never   Smokeless tobacco: Never  Substance and Sexual Activity   Alcohol use: Yes    Comment: 1   Drug use: Never   Sexual activity: Not Currently  Other Topics Concern   Not on file  Social History Narrative   Not on file   Social Determinants of Health   Financial Resource Strain: Not on file  Food Insecurity: Not on file  Transportation Needs: Not on file  Physical Activity: Not on file  Stress: Not on file  Social Connections: Not on file  Intimate Partner Violence: Not on file      Patient Care Team: Suzan Slick, MD as PCP - General (Family Medicine)   Outpatient Medications Prior to Visit  Medication Sig   diclofenac (VOLTAREN) 75 MG EC tablet Take 75 mg by mouth 2 (two) times daily.   lisinopril (ZESTRIL) 20 MG tablet TAKE 1 TABLET(20 MG) BY MOUTH DAILY   Multiple Vitamin (MULTIVITAMIN ADULT PO) Take 1 tablet by mouth daily.   No facility-administered  medications prior to visit.    Review of Systems  All other systems reviewed and are negative.        Objective:     BP 139/85   Pulse 87   Temp 98.2 F (36.8 C) (Oral)   Resp 18   Ht 5\' 2"  (1.575 m)   Wt 180 lb 12.8 oz (82 kg)   SpO2 99%   BMI 33.07 kg/m  BP Readings from Last 3 Encounters:  07/27/22 139/85  05/11/22 (!) 145/81  03/28/22 (!) 143/92      Physical Exam Vitals and nursing note reviewed.  Constitutional:      Appearance: Normal appearance. She is normal weight.  HENT:     Head: Normocephalic and atraumatic.     Right Ear: Tympanic membrane, ear canal and external ear normal.     Left Ear: Tympanic membrane, ear canal and external ear normal.     Nose: Nose normal.     Mouth/Throat:     Mouth: Mucous membranes are moist.      Pharynx: Oropharynx is clear.  Eyes:     Conjunctiva/sclera: Conjunctivae normal.     Pupils: Pupils are equal, round, and reactive to light.  Cardiovascular:     Rate and Rhythm: Normal rate and regular rhythm.     Pulses: Normal pulses.     Heart sounds: Normal heart sounds.  Pulmonary:     Effort: Pulmonary effort is normal.     Breath sounds: Normal breath sounds.  Abdominal:     General: Abdomen is flat. Bowel sounds are normal.  Musculoskeletal:        General: Normal range of motion.  Skin:    General: Skin is warm.     Capillary Refill: Capillary refill takes less than 2 seconds.  Neurological:     General: No focal deficit present.     Mental Status: She is alert and oriented to person, place, and time. Mental status is at baseline.  Psychiatric:        Mood and Affect: Mood normal.        Behavior: Behavior normal.        Thought Content: Thought content normal.        Judgment: Judgment normal.     No results found for any visits on 07/27/22.     Assessment & Plan:    Routine Health Maintenance and Physical Exam  Immunization History  Administered Date(s) Administered   Influenza, Seasonal, Injecte, Preservative Fre 12/29/2014, 12/29/2014, 12/12/2016, 03/04/2018   Influenza,inj,quad, With Preservative 12/12/2016, 03/04/2018   Influenza-Unspecified 12/29/2014, 12/29/2014, 12/12/2016, 12/12/2016, 03/04/2018, 03/04/2018, 01/11/2022   Janssen (J&J) SARS-COV-2 Vaccination 05/20/2019, 04/27/2020   PPD Test 11/30/2020   Tdap 12/29/2014    Health Maintenance  Topic Date Due   HIV Screening  Never done   COLONOSCOPY (Pts 45-19yrs Insurance coverage will need to be confirmed)  Never done   Zoster Vaccines- Shingrix (1 of 2) Never done   COVID-19 Vaccine (3 - 2023-24 season) 11/11/2021   INFLUENZA VACCINE  10/12/2022   PAP SMEAR-Modifier  03/05/2023   MAMMOGRAM  07/23/2023   DTaP/Tdap/Td (2 - Td or Tdap) 12/28/2024   Hepatitis C Screening  Completed   HPV  VACCINES  Aged Out    Discussed health benefits of physical activity, and encouraged her to engage in regular exercise appropriate for her age and condition.  Problem List Items Addressed This Visit   None  No follow-ups on file. Annual physical exam  Encounter for  lipid screening for cardiovascular disease -     Lipid panel  Impaired glucose tolerance -     CBC with Differential/Platelet -     Comprehensive metabolic panel -     Hemoglobin A1c  Essential hypertension -     Lisinopril; Take 1 tablet (20 mg total) by mouth daily.  Dispense: 90 tablet; Refill: 3   Screening labs Refilled Lisinopril 20mg  for HTN See back in 1 year sooner prn.    Suzan Slick, MD

## 2022-07-28 ENCOUNTER — Encounter: Payer: Self-pay | Admitting: Family Medicine

## 2022-07-28 DIAGNOSIS — E782 Mixed hyperlipidemia: Secondary | ICD-10-CM

## 2022-07-28 LAB — CBC WITH DIFFERENTIAL/PLATELET
Basophils Absolute: 0 10*3/uL (ref 0.0–0.2)
Basos: 1 %
EOS (ABSOLUTE): 0.2 10*3/uL (ref 0.0–0.4)
Eos: 3 %
Hematocrit: 38.9 % (ref 34.0–46.6)
Hemoglobin: 12.1 g/dL (ref 11.1–15.9)
Immature Grans (Abs): 0 10*3/uL (ref 0.0–0.1)
Immature Granulocytes: 0 %
Lymphocytes Absolute: 1.7 10*3/uL (ref 0.7–3.1)
Lymphs: 28 %
MCH: 25.9 pg — ABNORMAL LOW (ref 26.6–33.0)
MCHC: 31.1 g/dL — ABNORMAL LOW (ref 31.5–35.7)
MCV: 83 fL (ref 79–97)
Monocytes Absolute: 0.4 10*3/uL (ref 0.1–0.9)
Monocytes: 7 %
Neutrophils Absolute: 3.8 10*3/uL (ref 1.4–7.0)
Neutrophils: 61 %
Platelets: 254 10*3/uL (ref 150–450)
RBC: 4.68 x10E6/uL (ref 3.77–5.28)
RDW: 12 % (ref 11.7–15.4)
WBC: 6.2 10*3/uL (ref 3.4–10.8)

## 2022-07-28 LAB — LIPID PANEL
Chol/HDL Ratio: 3.5 ratio (ref 0.0–4.4)
Cholesterol, Total: 214 mg/dL — ABNORMAL HIGH (ref 100–199)
HDL: 62 mg/dL (ref >39)
LDL Chol Calc (NIH): 140 mg/dL — ABNORMAL HIGH (ref 0–99)
Triglycerides: 67 mg/dL (ref 0–149)
VLDL Cholesterol Cal: 12 mg/dL (ref 5–40)

## 2022-07-28 LAB — COMPREHENSIVE METABOLIC PANEL
ALT: 10 IU/L (ref 0–32)
AST: 12 IU/L (ref 0–40)
Albumin/Globulin Ratio: 1.7 (ref 1.2–2.2)
Albumin: 4.2 g/dL (ref 3.8–4.9)
Alkaline Phosphatase: 84 IU/L (ref 44–121)
BUN/Creatinine Ratio: 13 (ref 9–23)
BUN: 10 mg/dL (ref 6–24)
Bilirubin Total: 0.5 mg/dL (ref 0.0–1.2)
CO2: 22 mmol/L (ref 20–29)
Calcium: 9.7 mg/dL (ref 8.7–10.2)
Chloride: 105 mmol/L (ref 96–106)
Creatinine, Ser: 0.8 mg/dL (ref 0.57–1.00)
Globulin, Total: 2.5 g/dL (ref 1.5–4.5)
Glucose: 88 mg/dL (ref 70–99)
Potassium: 4.4 mmol/L (ref 3.5–5.2)
Sodium: 141 mmol/L (ref 134–144)
Total Protein: 6.7 g/dL (ref 6.0–8.5)
eGFR: 85 mL/min/{1.73_m2} (ref >59)

## 2022-07-28 LAB — HEMOGLOBIN A1C
Est. average glucose Bld gHb Est-mCnc: 111 mg/dL
Hgb A1c MFr Bld: 5.5 % (ref 4.8–5.6)

## 2022-07-28 MED ORDER — ATORVASTATIN CALCIUM 10 MG PO TABS
10.0000 mg | ORAL_TABLET | Freq: Every evening | ORAL | 3 refills | Status: AC
Start: 2022-07-28 — End: ?

## 2022-09-05 ENCOUNTER — Telehealth: Payer: Self-pay | Admitting: Family Medicine

## 2022-09-05 NOTE — Telephone Encounter (Signed)
Patient calling in to request an order placed for an annual mammogram screening. Patient did not indicate a preference of location. Please advise. Whitney Roman

## 2022-09-06 NOTE — Telephone Encounter (Signed)
Called patient and she states that she would like to have her mammogram done in [facility], if that is possible  Please Advise

## 2022-10-17 ENCOUNTER — Encounter: Payer: Self-pay | Admitting: Family Medicine

## 2022-10-17 DIAGNOSIS — Z1231 Encounter for screening mammogram for malignant neoplasm of breast: Secondary | ICD-10-CM

## 2022-10-19 ENCOUNTER — Ambulatory Visit: Payer: BC Managed Care – PPO

## 2022-12-06 ENCOUNTER — Ambulatory Visit: Payer: BC Managed Care – PPO

## 2022-12-06 DIAGNOSIS — Z1231 Encounter for screening mammogram for malignant neoplasm of breast: Secondary | ICD-10-CM

## 2023-08-24 ENCOUNTER — Other Ambulatory Visit: Payer: Self-pay | Admitting: Family Medicine

## 2023-08-24 DIAGNOSIS — E782 Mixed hyperlipidemia: Secondary | ICD-10-CM

## 2023-09-04 IMAGING — MG MM DIGITAL SCREENING BILAT W/ TOMO AND CAD
8 series · 8 of 24 positions shown · non-contrast
Comparison: Previous exam(s).

CLINICAL DATA: Screening.

EXAM:
DIGITAL SCREENING BILATERAL MAMMOGRAM WITH TOMOSYNTHESIS AND CAD
TECHNIQUE: Bilateral screening digital craniocaudal and mediolateral oblique
mammograms were obtained. Bilateral screening digital breast
tomosynthesis was performed. The images were evaluated with
computer-aided detection.

[L CC synth-2D]
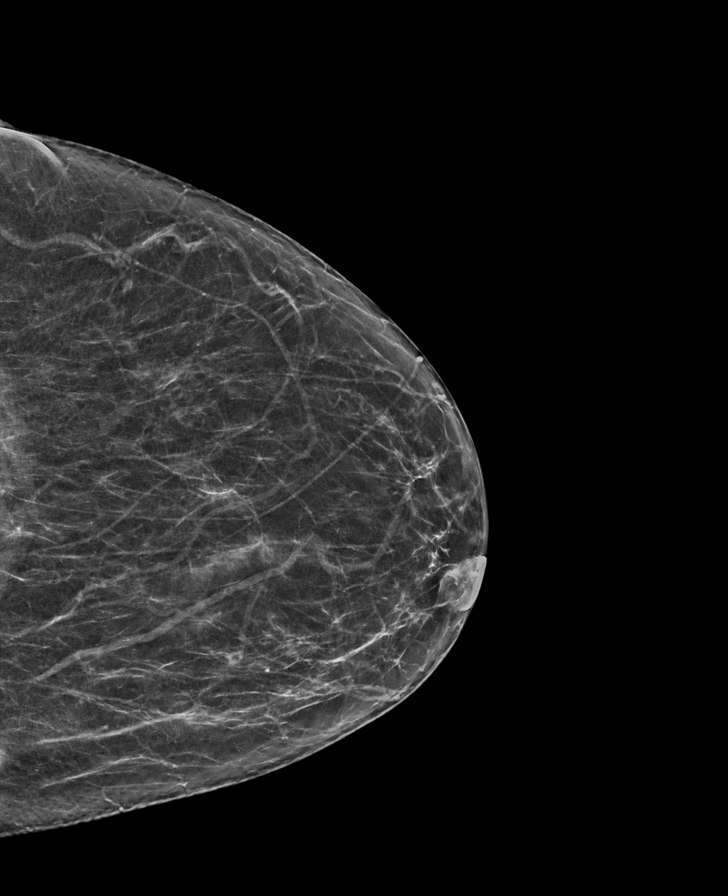

[R CC synth-2D]
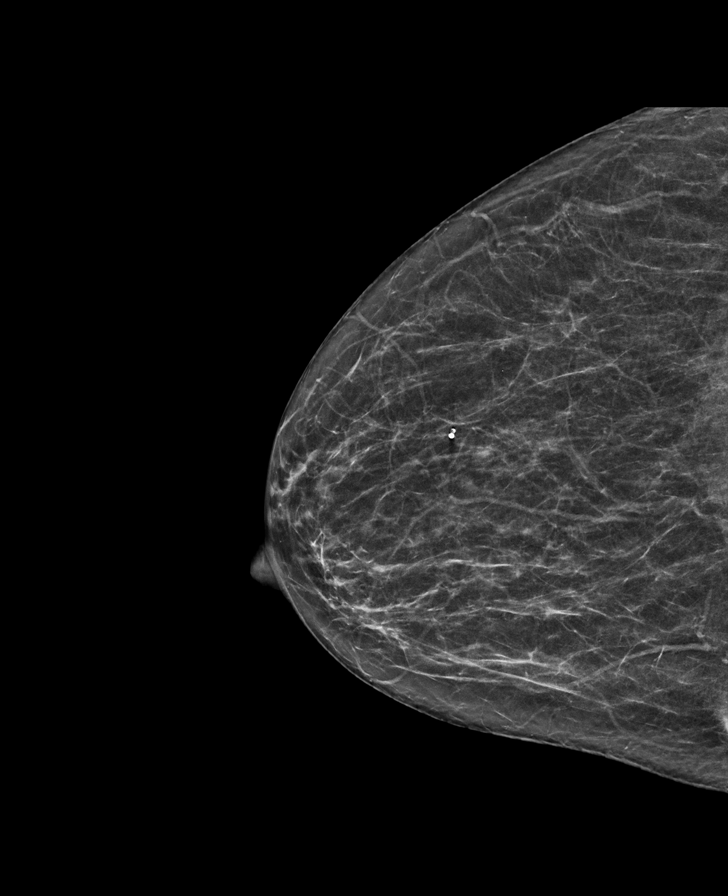

[R MLO synth-2D]
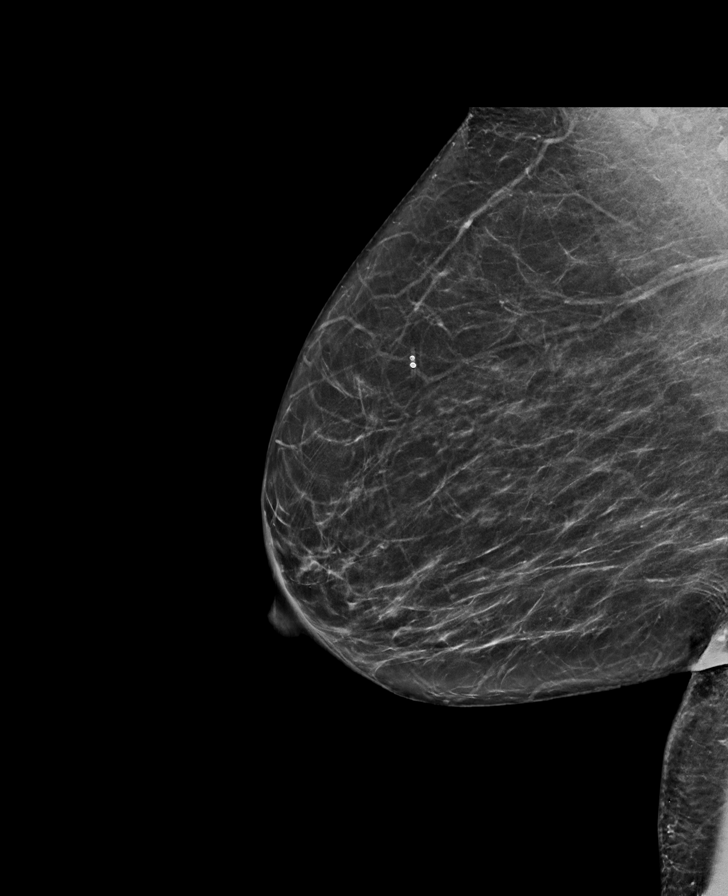

[L MLO synth-2D]
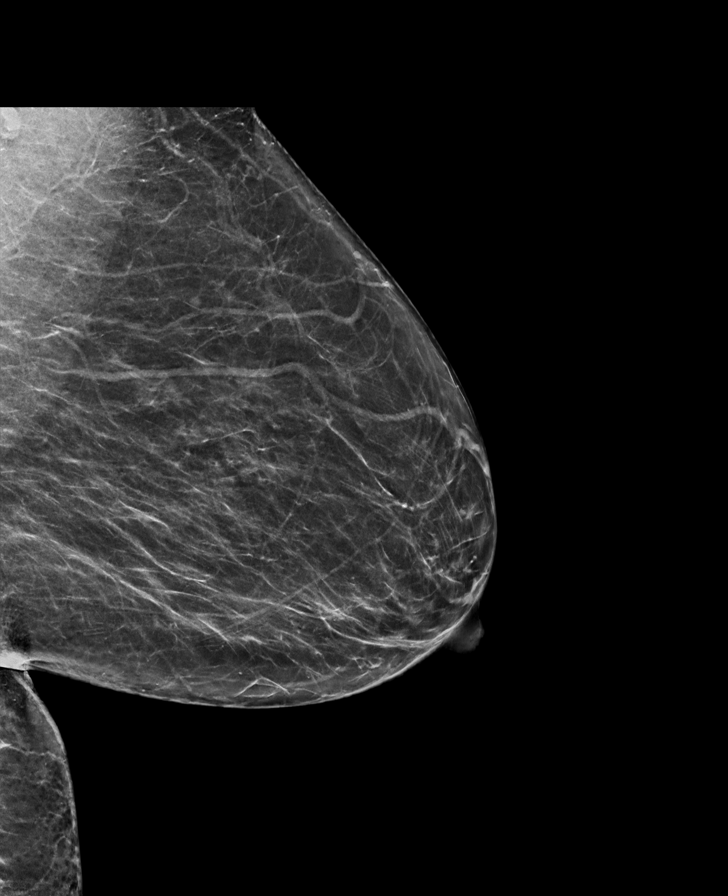

[L MLO tomo · tomo slice 37/72.0]
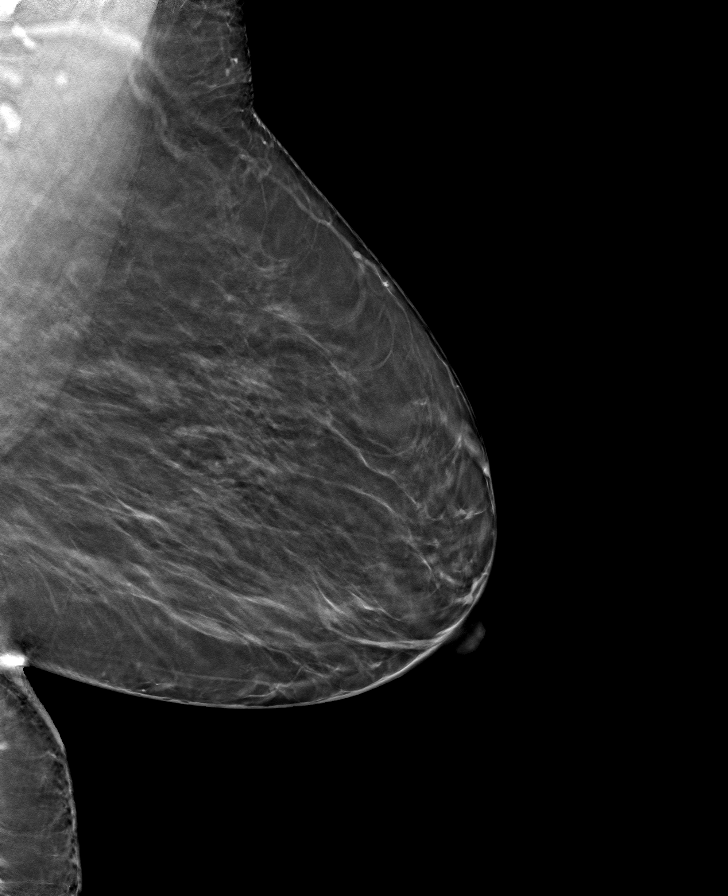

[R CC tomo · tomo slice 31/62.0]
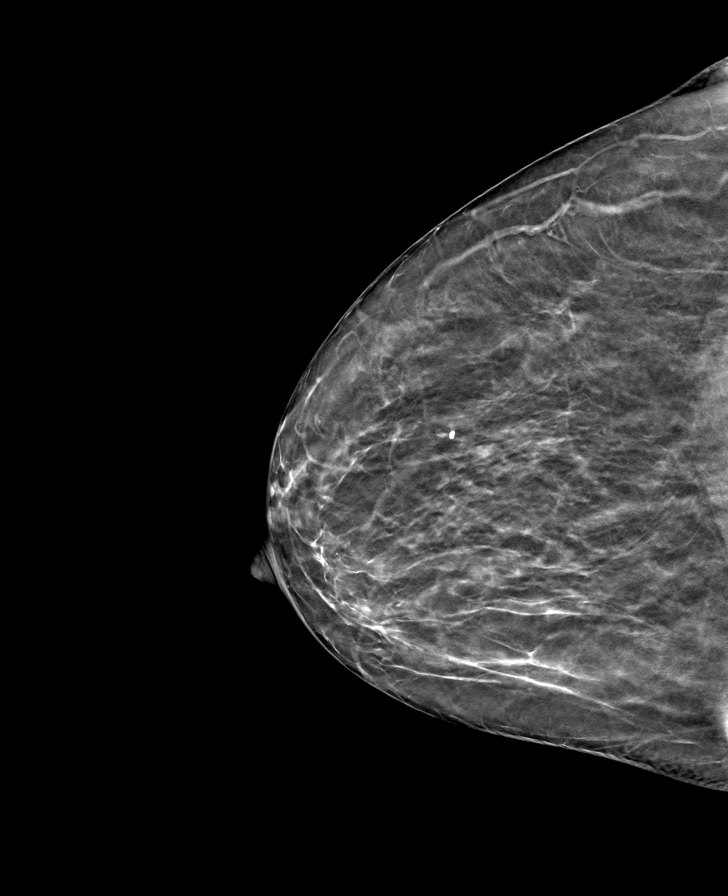

[R MLO tomo · tomo slice 35/68.0]
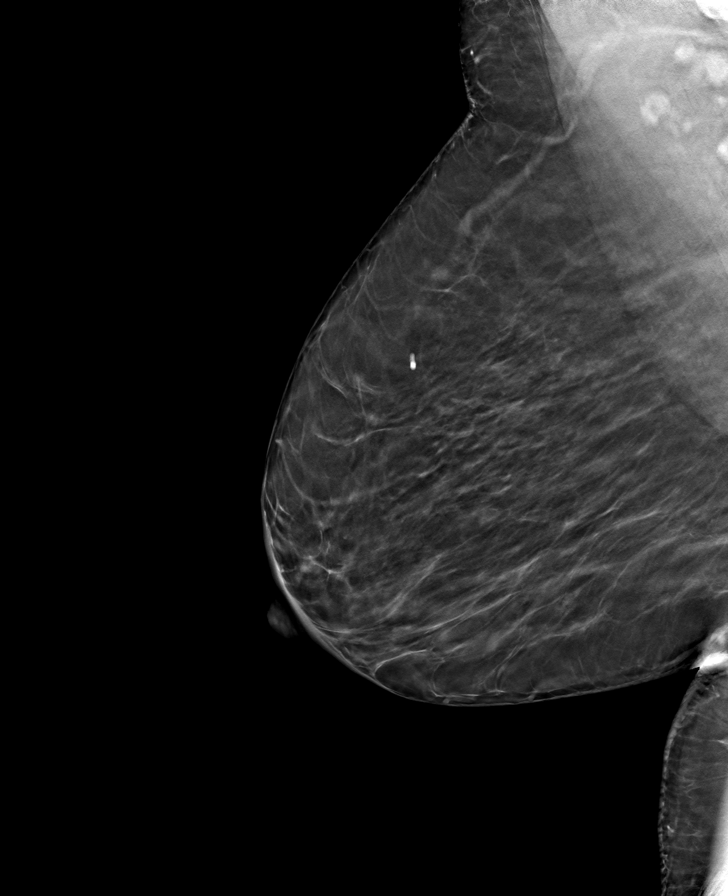

[L CC tomo · tomo slice 32/63.0]
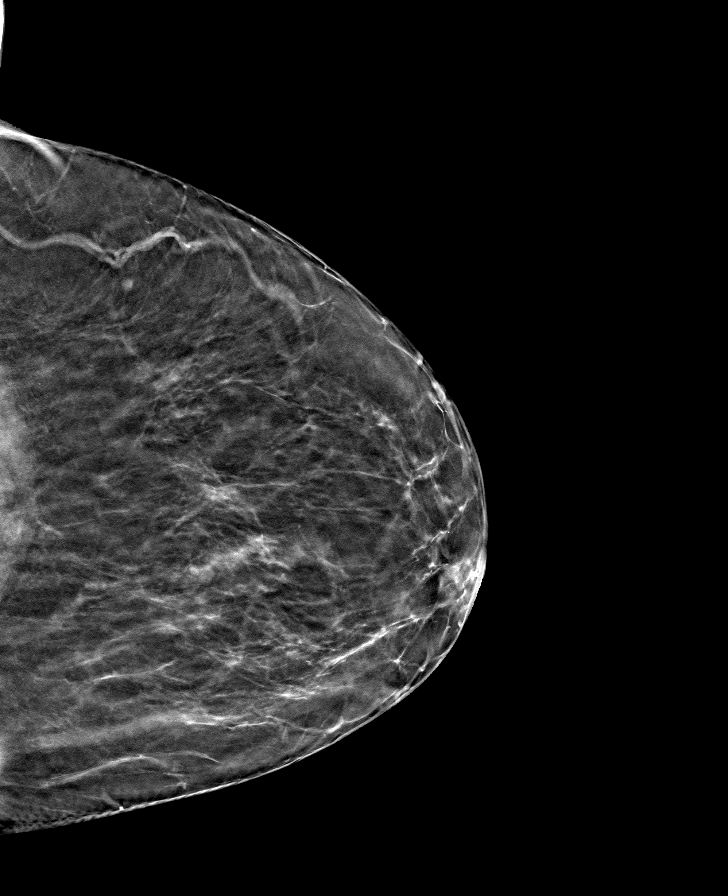

[8 of 24 positions shown; findings below may reference images not displayed]

ACR Breast Density Category b: There are scattered areas of
fibroglandular density.
FINDINGS: There are no findings suspicious for malignancy.
IMPRESSION: No mammographic evidence of malignancy. A result letter of this
screening mammogram will be mailed directly to the patient.

RECOMMENDATION:
Screening mammogram in one year. (Code:51-O-LD2)

BI-RADS CATEGORY  1: Negative.

## 2023-09-27 ENCOUNTER — Other Ambulatory Visit: Payer: Self-pay | Admitting: Family Medicine

## 2023-09-27 DIAGNOSIS — I10 Essential (primary) hypertension: Secondary | ICD-10-CM

## 2023-11-08 ENCOUNTER — Other Ambulatory Visit: Payer: Self-pay | Admitting: Family Medicine

## 2023-11-08 DIAGNOSIS — I10 Essential (primary) hypertension: Secondary | ICD-10-CM

## 2023-11-29 ENCOUNTER — Other Ambulatory Visit: Payer: Self-pay | Admitting: Family Medicine

## 2023-11-29 DIAGNOSIS — E782 Mixed hyperlipidemia: Secondary | ICD-10-CM

## 2023-12-11 ENCOUNTER — Ambulatory Visit (INDEPENDENT_AMBULATORY_CARE_PROVIDER_SITE_OTHER): Payer: PRIVATE HEALTH INSURANCE | Admitting: Family Medicine

## 2023-12-11 ENCOUNTER — Encounter: Payer: Self-pay | Admitting: Family Medicine

## 2023-12-11 VITALS — BP 130/82 | HR 74 | Temp 98.6°F | Resp 18 | Ht 62.0 in | Wt 184.3 lb

## 2023-12-11 DIAGNOSIS — Z13228 Encounter for screening for other metabolic disorders: Secondary | ICD-10-CM | POA: Diagnosis not present

## 2023-12-11 DIAGNOSIS — Z0000 Encounter for general adult medical examination without abnormal findings: Secondary | ICD-10-CM

## 2023-12-11 DIAGNOSIS — E782 Mixed hyperlipidemia: Secondary | ICD-10-CM | POA: Insufficient documentation

## 2023-12-11 DIAGNOSIS — I10 Essential (primary) hypertension: Secondary | ICD-10-CM | POA: Diagnosis not present

## 2023-12-11 DIAGNOSIS — Z130 Encounter for screening for diseases of the blood and blood-forming organs and certain disorders involving the immune mechanism: Secondary | ICD-10-CM | POA: Insufficient documentation

## 2023-12-11 DIAGNOSIS — Z23 Encounter for immunization: Secondary | ICD-10-CM | POA: Diagnosis not present

## 2023-12-11 MED ORDER — LISINOPRIL 20 MG PO TABS: 20.0000 mg | ORAL_TABLET | Freq: Every day | ORAL | 1 refills | Status: AC

## 2023-12-11 NOTE — Assessment & Plan Note (Signed)
 Lisinopril  20 mg daily. Has not taken dose today before this visit. BP well controlled. Refill sent. Follow-up with PCP in 6 months for med management.

## 2023-12-11 NOTE — Progress Notes (Signed)
 Complete physical exam  Patient: Whitney Roman   DOB: Dec 09, 1960   63 y.o. Female  MRN: [id number]  Subjective:    No chief complaint on file.   Whitney Roman is a 63 y.o. female who presents today for a complete physical exam. She reports consuming a intermittent fasting diet. Walking 30 minutes per day x 5 days She generally feels well. She reports sleeping poorly due to back pain. She does not have additional problems to discuss today.    Most recent fall risk assessment:    12/11/2023    8:51 AM  Fall Risk   Falls in the past year? 0  Number falls in past yr: 0  Injury with Fall? 0  Risk for fall due to : No Fall Risks  Follow up Falls evaluation completed     Most recent depression screenings:    12/11/2023    8:51 AM 03/28/2022    8:46 AM  PHQ 2/9 Scores  PHQ - 2 Score 0 0  PHQ- 9 Score 0 4    Vision:Within last year and Dental: No current dental problems and Receives regular dental care    Patient Care Team: Colette Torrence GRADE, MD as PCP - General (Family Medicine)   Outpatient Medications Prior to Visit  Medication Sig   atorvastatin  (LIPITOR) 10 MG tablet TAKE 1 TABLET(10 MG) BY MOUTH AT BEDTIME   diclofenac (VOLTAREN) 75 MG EC tablet Take 75 mg by mouth 2 (two) times daily.   Multiple Vitamin (MULTIVITAMIN ADULT PO) Take 1 tablet by mouth daily.   [DISCONTINUED] lisinopril  (ZESTRIL ) 20 MG tablet Take 1 tablet (20 mg total) by mouth daily.   No facility-administered medications prior to visit.    ROS        Objective:     BP 130/82 (Patient Position: Sitting, Cuff Size: Normal)   Pulse 74   Temp 98.6 F (37 C) (Oral)   Resp 18   Ht 5' 2 (1.575 m)   Wt 184 lb 4.8 oz (83.6 kg)   SpO2 98%   BMI 33.71 kg/m    Physical Exam Vitals and nursing note reviewed.  Constitutional:      General: She is not in acute distress.    Appearance: Normal appearance.  HENT:     Right Ear: Tympanic membrane normal.     Left Ear: Tympanic membrane normal.      Nose: Nose normal.     Mouth/Throat:     Mouth: Mucous membranes are moist.     Pharynx: Oropharynx is clear.  Eyes:     Extraocular Movements: Extraocular movements intact.  Neck:     Thyroid: No thyroid tenderness.  Cardiovascular:     Rate and Rhythm: Normal rate and regular rhythm.     Pulses:          Radial pulses are 2+ on the right side and 2+ on the left side.     Heart sounds: Normal heart sounds, S1 normal and S2 normal.  Pulmonary:     Effort: Pulmonary effort is normal.     Breath sounds: Normal breath sounds.  Abdominal:     General: Bowel sounds are normal.     Palpations: Abdomen is soft.     Tenderness: There is no abdominal tenderness.  Musculoskeletal:        General: Normal range of motion.     Cervical back: Normal range of motion.     Right lower leg: No edema.  Left lower leg: No edema.  Lymphadenopathy:     Cervical:     Right cervical: No superficial cervical adenopathy.    Left cervical: No superficial cervical adenopathy.  Skin:    General: Skin is warm and dry.  Neurological:     General: No focal deficit present.     Mental Status: She is alert. Mental status is at baseline.  Psychiatric:        Mood and Affect: Mood normal.        Behavior: Behavior normal.        Thought Content: Thought content normal.        Judgment: Judgment normal.      No results found for any visits on 12/11/23.     Assessment & Plan:    Routine Health Maintenance and Physical Exam  Immunization History  Administered Date(s) Administered   Influenza, Seasonal, Injecte, Preservative Fre 12/29/2014, 12/29/2014, 12/12/2016, 03/04/2018, 12/11/2023   Influenza,inj,quad, With Preservative 12/12/2016, 03/04/2018   Influenza-Unspecified 12/29/2014, 12/29/2014, 12/12/2016, 12/12/2016, 03/04/2018, 03/04/2018, 01/11/2022   Janssen (J&J) SARS-COV-2 Vaccination 05/20/2019, 04/27/2020   PPD Test 11/30/2020   Tdap 12/29/2014    Health Maintenance  Topic Date Due    HIV Screening  Never done   Cervical Cancer Screening (HPV/Pap Cotest)  Never done   Pneumococcal Vaccine: 50+ Years (1 of 1 - PCV) Never done   Zoster Vaccines- Shingrix (1 of 2) Never done   COVID-19 Vaccine (3 - 2025-26 season) 11/12/2023   Mammogram  12/05/2024   DTaP/Tdap/Td (2 - Td or Tdap) 12/28/2024   Colonoscopy  04/12/2032   Influenza Vaccine  Completed   Hepatitis C Screening  Completed   Hepatitis B Vaccines 19-59 Average Risk  Aged Out   HPV VACCINES  Aged Out   Meningococcal B Vaccine  Aged Out    Discussed health benefits of physical activity, and encouraged her to engage in regular exercise appropriate for her age and condition.  Annual physical exam  Encounter for screening for metabolic disorder -     Hemoglobin A1c -     Comprehensive metabolic panel with GFR -     TSH + free T4  Essential hypertension Assessment & Plan: Lisinopril  20 mg daily. Has not taken dose today before this visit. BP well controlled. Refill sent. Follow-up with PCP in 6 months for med management.   Orders: -     Comprehensive metabolic panel with GFR -     Lisinopril ; Take 1 tablet (20 mg total) by mouth daily.  Dispense: 90 tablet; Refill: 1  Mixed hyperlipidemia -     Lipid panel  Screening for deficiency anemia -     CBC  Encounter for administration of vaccine -     Flu vaccine trivalent PF, 6mos and older(Flulaval,Afluria,Fluarix,Fluzone)      Routine labs ordered.  HCM reviewed/discussed. Flu vaccine today.  Anticipatory guidance regarding healthy weight, lifestyle and choices given. Recommend healthy diet.  Recommend approximately 150 minutes/week of moderate intensity exercise. Resistance training is good for building muscles and for bone health. Muscle mass helps to increase our metabolism and to burn more calories at rest.  Limit alcohol consumption: no more than one drink per day for women and 2 drinks per day for me. Recommend regular dental and vision  exams. Always use seatbelt/lap and shoulder restraints. Recommend using smoke alarms and checking batteries at least twice a year. Recommend using sunscreen when outside.  Agrees with plan of care discussed.  Questions answered.  Return in about 5 weeks (around 01/14/2024) for pap only viist .     Darice JONELLE Brownie, FNP

## 2023-12-12 ENCOUNTER — Ambulatory Visit: Payer: Self-pay | Admitting: Family Medicine

## 2023-12-12 LAB — COMPREHENSIVE METABOLIC PANEL WITH GFR
ALT: 10 IU/L (ref 0–32)
AST: 10 IU/L (ref 0–40)
Albumin: 4.5 g/dL (ref 3.9–4.9)
Alkaline Phosphatase: 87 IU/L (ref 49–135)
BUN/Creatinine Ratio: 15 (ref 12–28)
BUN: 14 mg/dL (ref 8–27)
Bilirubin Total: 0.5 mg/dL (ref 0.0–1.2)
CO2: 23 mmol/L (ref 20–29)
Calcium: 9.8 mg/dL (ref 8.7–10.3)
Chloride: 102 mmol/L (ref 96–106)
Creatinine, Ser: 0.94 mg/dL (ref 0.57–1.00)
Globulin, Total: 2.7 g/dL (ref 1.5–4.5)
Glucose: 96 mg/dL (ref 70–99)
Potassium: 5 mmol/L (ref 3.5–5.2)
Sodium: 142 mmol/L (ref 134–144)
Total Protein: 7.2 g/dL (ref 6.0–8.5)
eGFR: 69 mL/min/1.73 (ref >59)

## 2023-12-12 LAB — LIPID PANEL
Chol/HDL Ratio: 3.1 ratio (ref 0.0–4.4)
Cholesterol, Total: 200 mg/dL — ABNORMAL HIGH (ref 100–199)
HDL: 65 mg/dL (ref >39)
LDL Chol Calc (NIH): 126 mg/dL — ABNORMAL HIGH (ref 0–99)
Triglycerides: 48 mg/dL (ref 0–149)
VLDL Cholesterol Cal: 9 mg/dL (ref 5–40)

## 2023-12-12 LAB — CBC
Hematocrit: 42.8 % (ref 34.0–46.6)
Hemoglobin: 13.4 g/dL (ref 11.1–15.9)
MCH: 27.1 pg (ref 26.6–33.0)
MCHC: 31.3 g/dL — ABNORMAL LOW (ref 31.5–35.7)
MCV: 87 fL (ref 79–97)
Platelets: 257 x10E3/uL (ref 150–450)
RBC: 4.94 x10E6/uL (ref 3.77–5.28)
RDW: 12.2 % (ref 11.7–15.4)
WBC: 6.1 x10E3/uL (ref 3.4–10.8)

## 2023-12-12 LAB — HEMOGLOBIN A1C
Est. average glucose Bld gHb Est-mCnc: 108 mg/dL
Hgb A1c MFr Bld: 5.4 % (ref 4.8–5.6)

## 2023-12-12 LAB — TSH+FREE T4
Free T4: 1.33 ng/dL (ref 0.82–1.77)
TSH: 1.14 u[IU]/mL (ref 0.450–4.500)

## 2024-01-07 ENCOUNTER — Other Ambulatory Visit (HOSPITAL_BASED_OUTPATIENT_CLINIC_OR_DEPARTMENT_OTHER): Payer: Self-pay | Admitting: Family Medicine

## 2024-01-07 DIAGNOSIS — Z1231 Encounter for screening mammogram for malignant neoplasm of breast: Secondary | ICD-10-CM

## 2024-01-17 ENCOUNTER — Ambulatory Visit (HOSPITAL_BASED_OUTPATIENT_CLINIC_OR_DEPARTMENT_OTHER)
Admission: RE | Admit: 2024-01-17 | Discharge: 2024-01-17 | Disposition: A | Discharge status: Home / Self Care | Payer: PRIVATE HEALTH INSURANCE | Source: Ambulatory Visit | Attending: Family Medicine | Admitting: Family Medicine

## 2024-01-17 ENCOUNTER — Ambulatory Visit: Payer: PRIVATE HEALTH INSURANCE | Admitting: Family Medicine

## 2024-01-17 ENCOUNTER — Encounter (HOSPITAL_BASED_OUTPATIENT_CLINIC_OR_DEPARTMENT_OTHER): Payer: Self-pay

## 2024-01-17 DIAGNOSIS — Z1231 Encounter for screening mammogram for malignant neoplasm of breast: Secondary | ICD-10-CM | POA: Insufficient documentation

## 2024-01-22 ENCOUNTER — Ambulatory Visit: Payer: Self-pay | Admitting: Family Medicine

## 2024-02-13 ENCOUNTER — Ambulatory Visit: Payer: PRIVATE HEALTH INSURANCE | Admitting: Family Medicine
